# Patient Record
Sex: Female | Born: 1960 | Race: Black or African American | Hispanic: No | Marital: Married
Health system: Southern US, Community
[De-identification: ages and names within clinical notes are randomized; demographics above are authoritative.]

## PROBLEM LIST (undated history)

## (undated) DIAGNOSIS — I10 Essential (primary) hypertension: Secondary | ICD-10-CM

## (undated) DIAGNOSIS — E785 Hyperlipidemia, unspecified: Secondary | ICD-10-CM

## (undated) HISTORY — DX: Hyperlipidemia, unspecified: E78.5

## (undated) HISTORY — DX: Essential (primary) hypertension: I10

---

## 2010-07-23 ENCOUNTER — Ambulatory Visit: Payer: Self-pay | Admitting: Family Medicine

## 2010-08-04 ENCOUNTER — Ambulatory Visit: Payer: Self-pay | Admitting: Family Medicine

## 2010-09-15 ENCOUNTER — Ambulatory Visit: Payer: Self-pay | Admitting: Gastroenterology

## 2011-02-03 ENCOUNTER — Ambulatory Visit: Payer: Self-pay | Admitting: Family Medicine

## 2011-09-21 IMAGING — US ULTRASOUND RIGHT BREAST
1 series · 17 of 17 positions shown · non-contrast
Comparison: 07/23/2010.

REASON FOR EXAM: RT ASSYMMETRY
COMMENTS:

PROCEDURE:     US  - US BREAST RIGHT  - August 04, 2010 [DATE]
RESULT:

[Series 1: ultrasound right breast · 17 of 17 slices shown]
[im 1/17]
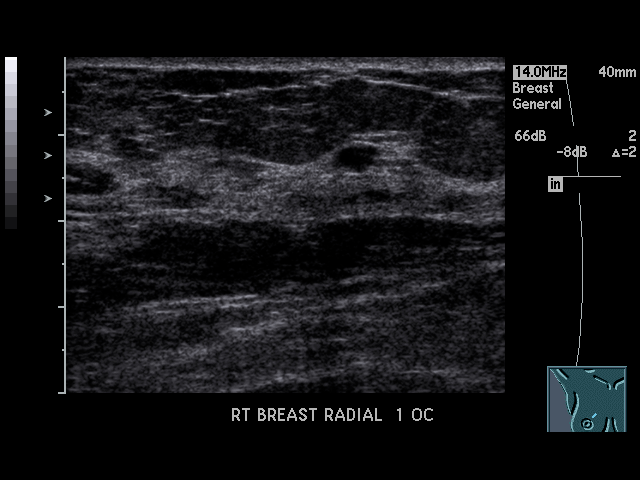
[im 2/17]
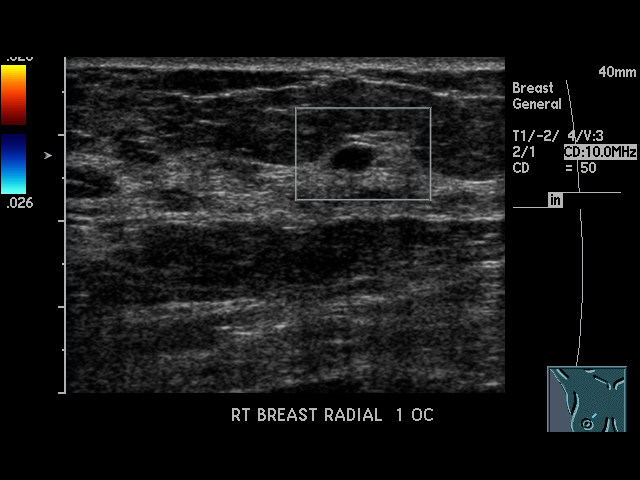
[im 3/17]
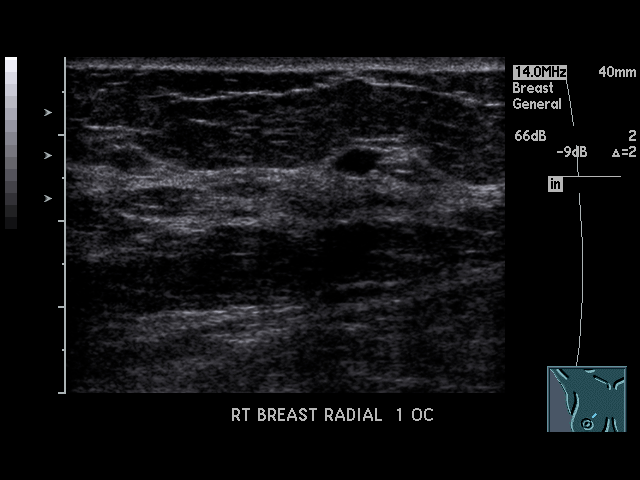
[im 4/17]
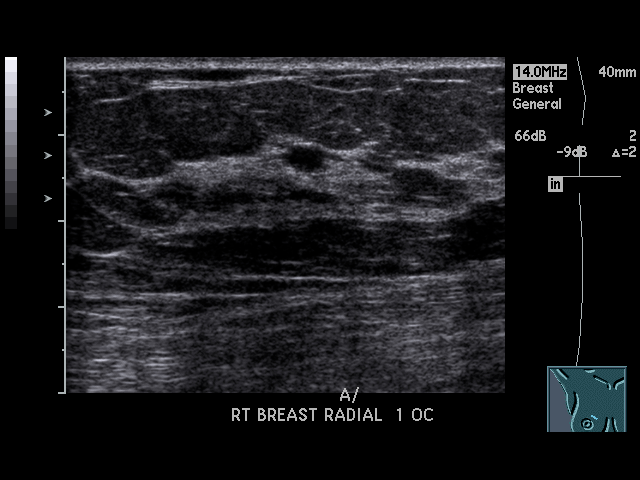
[im 5/17]
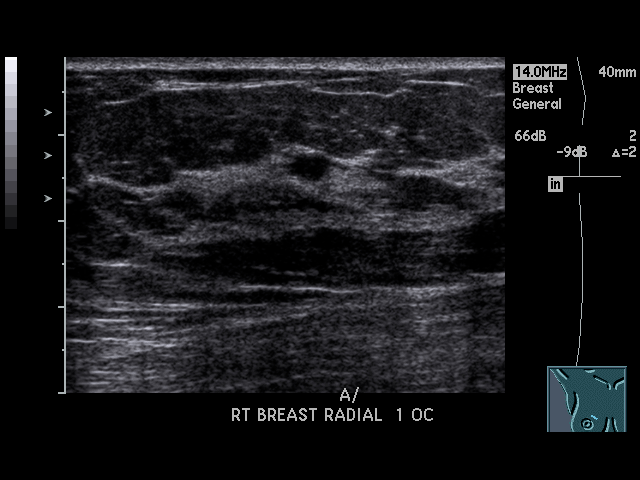
[im 6/17]
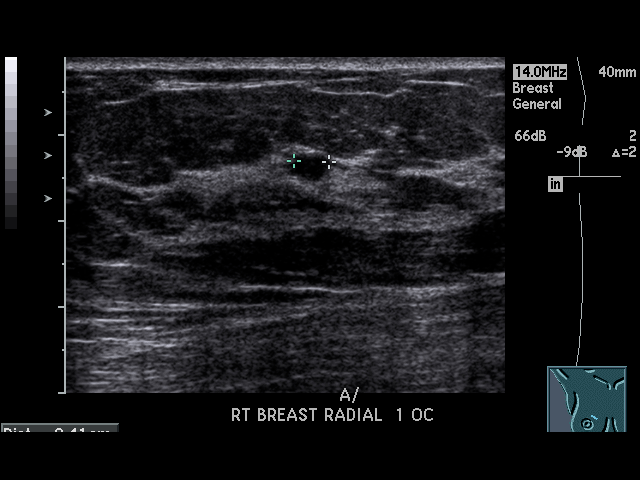
[im 7/17]
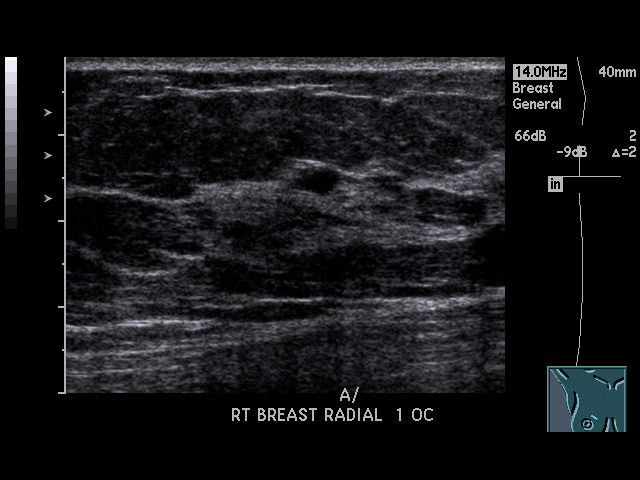
[im 8/17]
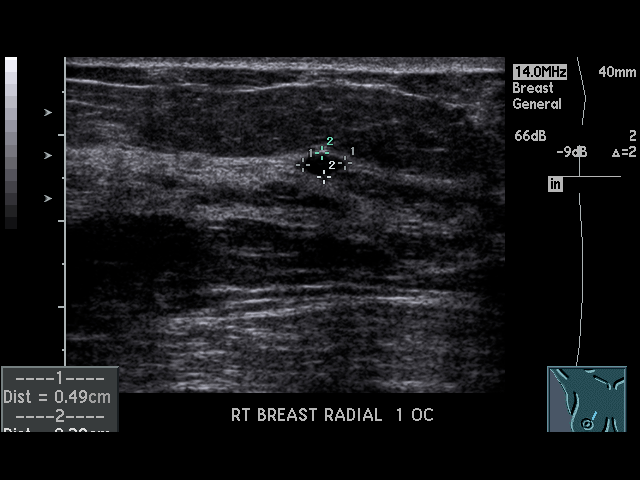
[im 9/17]
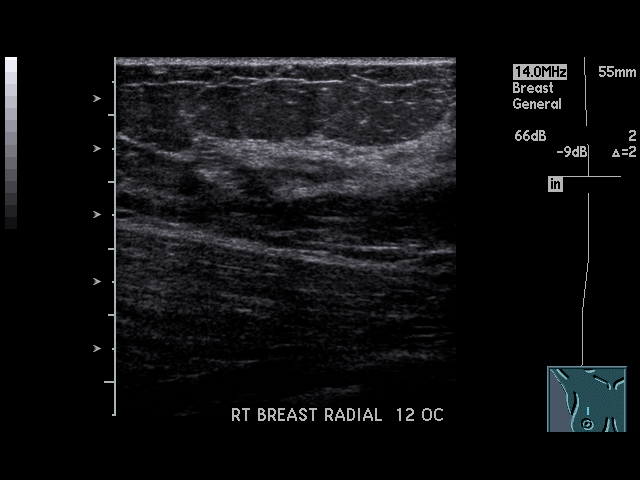
[im 10/17]
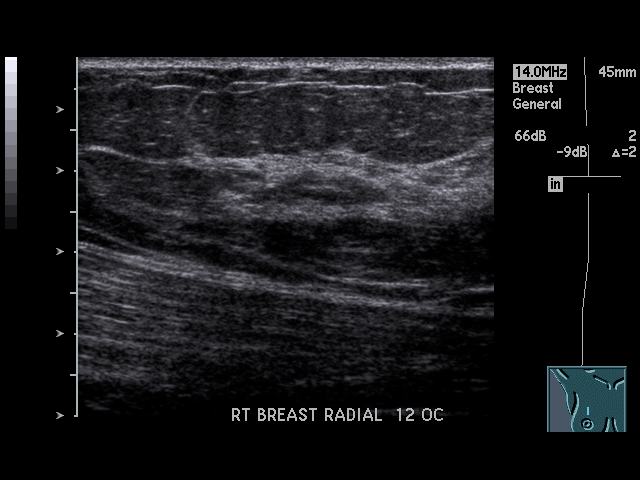
[im 11/17]
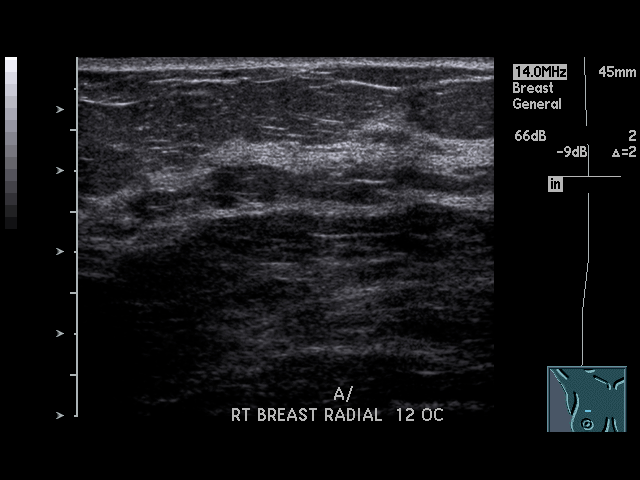
[im 12/17]
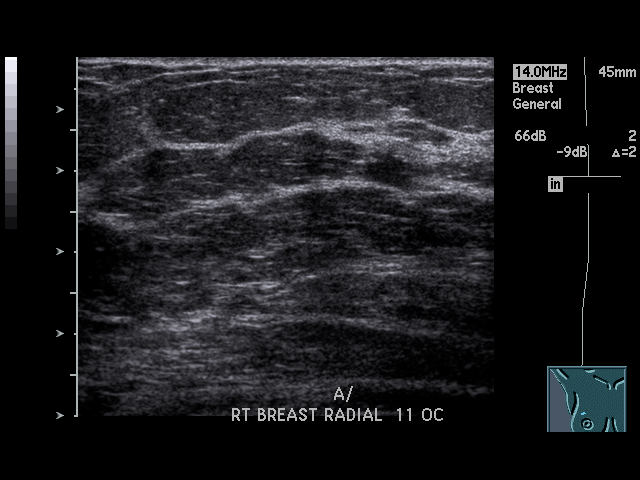
[im 13/17]
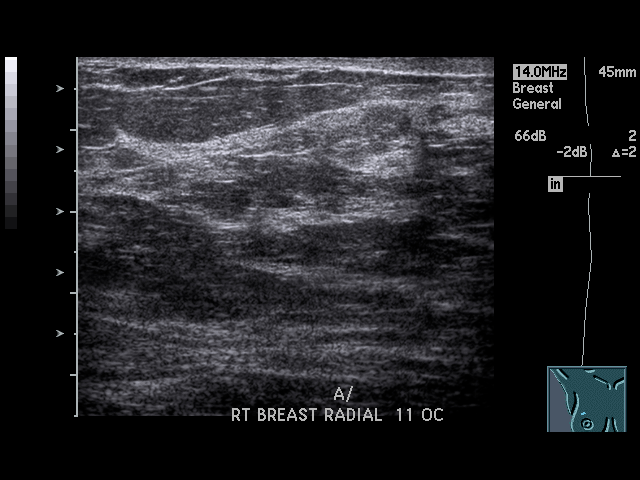
[im 14/17]
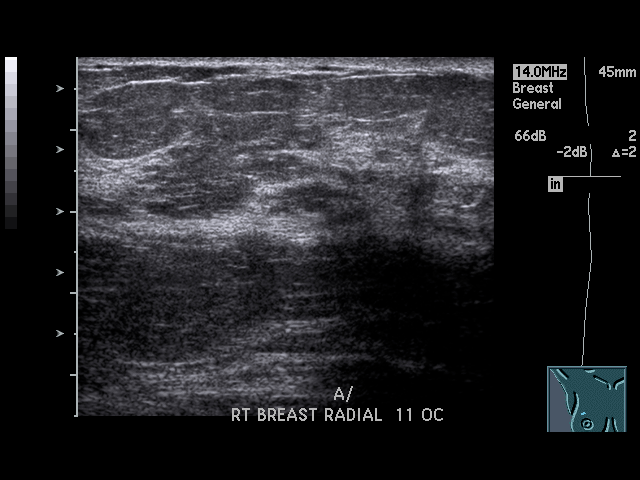
[im 15/17]
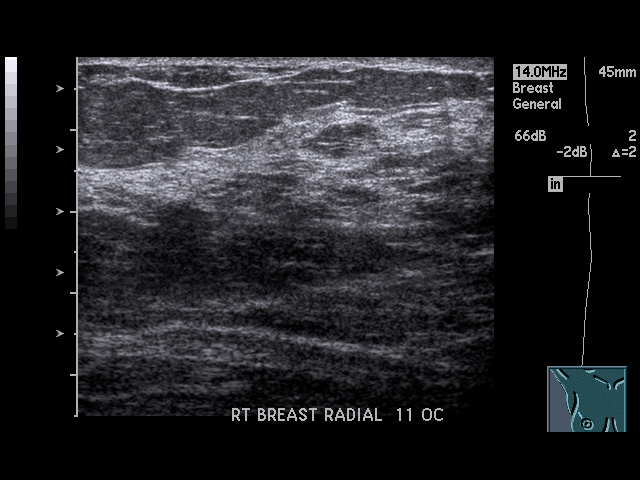
[im 16/17]
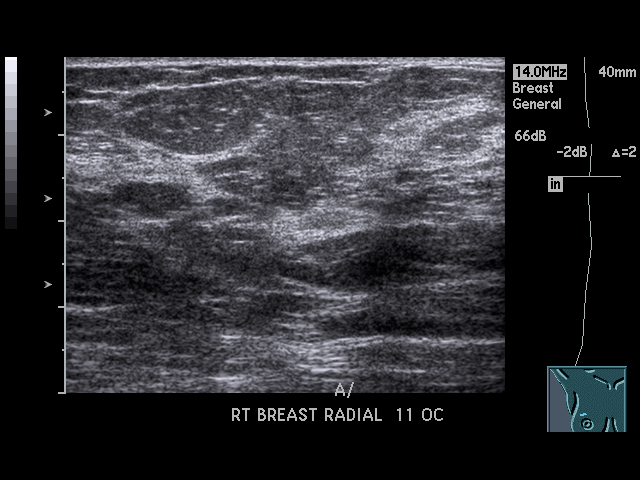
[im 17/17]
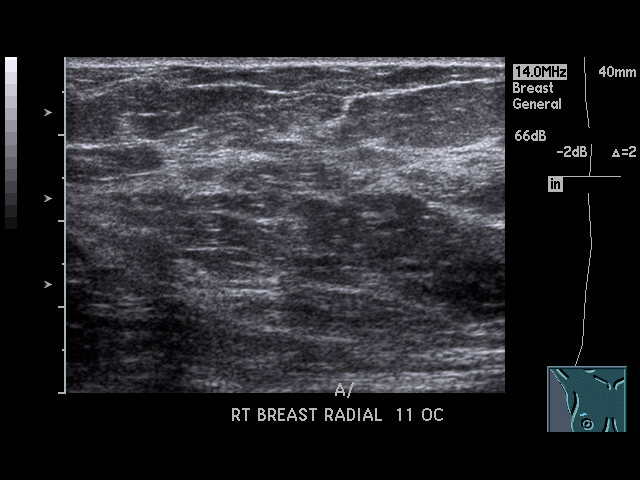

[17 of 17 positions shown; findings below may reference images not displayed]

FINDINGS: True lateral and spot compression views were performed to evaluate
the asymmetry seen in the right CC screening mammogram. There is a well
circumscribed, smoothly marginated, round mass in the superior right breast
at approximately 1 o'clock. On the magnification CC view, an additional,
well circumscribed, smoothly marginated, round mass is seen just lateral.
This appears to be present on the superior aspect of the lateral view, at
approximately 12 o'clock.

Real-time ultrasound was performed of the right breast from 11 to 1 o'clock.
At 1 o'clock, an anechoic mass is seen which measures 5 mm in greatest
diameter. There is a thin, imperceptible wall, with increased through
transmission. The findings are consistent with a benign cyst. The small mass
which was seen on the CC magnification view just lateral to the original
mass was not identified with ultrasound.
IMPRESSION: The small mass at 1 o'clock is consistent with a benign
cyst. The small, well circumscribed mass just lateral at approximately 12
o'clock was unable to be visualized by ultrasound. Given the multiplicity
and mammographic appearance, statistically this likely represents a cyst.
Six month followup mammograms are recommended to ensure stability. At that
time, spot compression views are recommended in addition to CC and true
lateral views.

BI-RADS: Category 3 - Probably Benign Finding (interval follow-up).

## 2015-10-05 HISTORY — PX: ROTATOR CUFF REPAIR: SHX139

## 2023-03-01 ENCOUNTER — Other Ambulatory Visit: Payer: Self-pay | Admitting: Internal Medicine

## 2023-03-01 DIAGNOSIS — I2089 Other forms of angina pectoris: Secondary | ICD-10-CM

## 2023-03-01 DIAGNOSIS — R079 Chest pain, unspecified: Secondary | ICD-10-CM

## 2023-03-31 ENCOUNTER — Telehealth (HOSPITAL_COMMUNITY): Payer: Self-pay | Admitting: *Deleted

## 2023-03-31 MED ORDER — IVABRADINE HCL 7.5 MG PO TABS: ORAL_TABLET | ORAL | 0 refills | Status: AC

## 2023-03-31 MED ORDER — METOPROLOL TARTRATE 100 MG PO TABS: ORAL_TABLET | ORAL | 0 refills | Status: AC

## 2023-03-31 NOTE — Telephone Encounter (Signed)
Attempted to call patient regarding upcoming cardiac CT appointment. °Left message on voicemail with name and callback number ° °Charniece Venturino RN Navigator Cardiac Imaging °[facility] °[phone] Office °[phone] Cell ° °

## 2023-03-31 NOTE — Telephone Encounter (Signed)
Patient returning call about her upcoming cardiac imaging study; pt verbalizes understanding of appt date/time, parking situation and where to check in, pre-test NPO status and medications ordered, and verified current allergies; name and call back number provided for further questions should they arise  Larey Brick RN Navigator Cardiac Imaging [facility] [phone] office [phone] cell  Patient to hold her Maxide and take 100mg  metoprolol tartrate and 15mg  ivabradine two hours prior to her cardiac CT scan.

## 2023-04-04 ENCOUNTER — Ambulatory Visit
Admission: RE | Admit: 2023-04-04 | Discharge: 2023-04-04 | Disposition: A | Discharge status: Home / Self Care | Payer: BC Managed Care – PPO | Source: Ambulatory Visit | Attending: Internal Medicine | Admitting: Internal Medicine

## 2023-04-04 DIAGNOSIS — I2089 Other forms of angina pectoris: Secondary | ICD-10-CM | POA: Insufficient documentation

## 2023-04-04 DIAGNOSIS — R079 Chest pain, unspecified: Secondary | ICD-10-CM | POA: Diagnosis present

## 2023-04-04 LAB — POCT I-STAT CREATININE: Creatinine, Ser: 0.9 mg/dL (ref 0.44–1.00)

## 2023-04-04 MED ORDER — IOHEXOL 350 MG/ML SOLN
80.0000 mL | Freq: Once | INTRAVENOUS | Status: AC | PRN
  Administered 2023-04-04: 80 mL via INTRAVENOUS

## 2023-04-04 MED ORDER — NITROGLYCERIN 0.4 MG SL SUBL
0.8000 mg | SUBLINGUAL_TABLET | Freq: Once | SUBLINGUAL | Status: AC
  Administered 2023-04-04: 0.8 mg via SUBLINGUAL
  Filled 2023-04-04: qty 25

## 2023-04-04 NOTE — Progress Notes (Signed)
Pt tolerated procedure well with no issues. Pt ABCs intact. Pt denies any complaints. Pt encouraged to drink plenty of water throughout the day. Pt ambulatory with steady gait.

## 2024-05-01 ENCOUNTER — Encounter (INDEPENDENT_AMBULATORY_CARE_PROVIDER_SITE_OTHER): Payer: Self-pay | Admitting: Vascular Surgery

## 2024-05-01 ENCOUNTER — Ambulatory Visit (INDEPENDENT_AMBULATORY_CARE_PROVIDER_SITE_OTHER): Payer: Self-pay | Admitting: Vascular Surgery

## 2024-05-01 VITALS — BP 119/85 | HR 85 | Resp 16 | Ht 62.0 in | Wt 216.4 lb

## 2024-05-01 DIAGNOSIS — E785 Hyperlipidemia, unspecified: Secondary | ICD-10-CM | POA: Diagnosis not present

## 2024-05-01 DIAGNOSIS — I890 Lymphedema, not elsewhere classified: Secondary | ICD-10-CM | POA: Diagnosis not present

## 2024-05-01 DIAGNOSIS — I10 Essential (primary) hypertension: Secondary | ICD-10-CM | POA: Diagnosis not present

## 2024-05-10 ENCOUNTER — Encounter (INDEPENDENT_AMBULATORY_CARE_PROVIDER_SITE_OTHER): Payer: Self-pay | Admitting: Vascular Surgery

## 2024-05-10 NOTE — Progress Notes (Signed)
 Subjective:    Patient ID: Jean Pollard, female    DOB: [birth year], 63 y.o.   MRN: [id number] Chief Complaint  Patient presents with   Establish Care    New patient consult, LE Edema. Ref.[place]    Jean Pollard is a 63 yo female who presented to clinic today with chief complaint of bilateral lower extremity edema.  Patient endorses that it is not painful but after standing all day she gets a throbbing to her lower extremities that is irritating.  The swelling is pretty much consistent every day.  It is better in the morning after she gets out of bed but as the day progresses it gets provide worse.  Patient endorses that she has not tried any conventional therapy at this time.  Nor has she had any ultrasounds to her bilateral lower extremities.    Review of Systems  Constitutional: Negative.   Cardiovascular:  Positive for leg swelling.  All other systems reviewed and are negative.      Objective:   Physical Exam Vitals reviewed.  Constitutional:      Appearance: Normal appearance. She is obese.  HENT:     Head: Normocephalic.  Eyes:     Pupils: Pupils are equal, round, and reactive to light.  Cardiovascular:     Rate and Rhythm: Normal rate and regular rhythm.     Pulses: Normal pulses.     Heart sounds: Normal heart sounds.  Pulmonary:     Effort: Pulmonary effort is normal.     Breath sounds: Normal breath sounds.  Abdominal:     General: Abdomen is flat. Bowel sounds are normal.     Palpations: Abdomen is soft.  Musculoskeletal:        General: Swelling present.     Right lower leg: Edema present.     Left lower leg: Edema present.  Skin:    General: Skin is warm and dry.     Capillary Refill: Capillary refill takes 2 to 3 seconds.  Neurological:     General: No focal deficit present.     Mental Status: She is alert and oriented to person, place, and time. Mental status is at baseline.  Psychiatric:        Mood and Affect: Mood normal.        Behavior:  Behavior normal.        Thought Content: Thought content normal.        Judgment: Judgment normal.     BP 119/85 (BP Location: Right Arm, Patient Position: Sitting, Cuff Size: Large)   Pulse 85   Resp 16   Ht 5' 2 (1.575 m)   Wt 216 lb 6.4 oz (98.2 kg)   BMI 39.58 kg/m   Past Medical History:  Diagnosis Date   Hyperlipidemia    Hypertension     Social History   Socioeconomic History   Marital status: Married    Spouse name: Not on file   Number of children: Not on file   Years of education: Not on file   Highest education level: Not on file  Occupational History   Not on file  Tobacco Use   Smoking status: Never   Smokeless tobacco: Never  Vaping Use   Vaping status: Never Used  Substance and Sexual Activity   Alcohol use: Never   Drug use: Never   Sexual activity: Not on file  Other Topics Concern   Not on file  Social History Narrative   Not on file  Social Drivers of Corporate investment banker Strain: Low Risk  (02/20/2024)   Received from [facility]   Overall Financial Resource Strain (CARDIA)    Difficulty of Paying Living Expenses: Not very hard  Food Insecurity: No Food Insecurity (02/20/2024)   Received from [facility]   Hunger Vital Sign    Within the past 12 months, you worried that your food would run out before you got the money to buy more.: Never true    Within the past 12 months, the food you bought just didn't last and you didn't have money to get more.: Never true  Transportation Needs: No Transportation Needs (02/20/2024)   Received from [facility] - Transportation    In the past 12 months, has lack of transportation kept you from medical appointments or from getting medications?: No    Lack of Transportation (Non-Medical): No  Physical Activity: Not on file  Stress: Not on file  Social Connections: Not on file  Intimate Partner Violence: Not on file    Past Surgical  History:  Procedure Laterality Date   ROTATOR CUFF REPAIR Right 2017    Family History  Problem Relation Age of Onset   Cancer Mother        breast/lung    Allergies  Allergen Reactions   Simvastatin Rash        No data to display            CMP     Component Value Date/Time   CREATININE 0.90 04/04/2023 0818     No results found.     Assessment & Plan:   1. Lymphedema (Primary) Recommend:  I have had a long discussion with the patient regarding swelling and why it  causes symptoms.  Patient will begin wearing graduated compression on a daily basis a prescription was given. The patient will  wear the stockings first thing in the morning and removing them in the evening. The patient is instructed specifically not to sleep in the stockings.   In addition, behavioral modification will be initiated.  This will include frequent elevation, use of over the counter pain medications and exercise such as walking.  Consideration for a lymph pump will also be made based upon the effectiveness of conservative therapy.  This would help to improve the edema control and prevent sequela such as ulcers and infections   Patient should undergo duplex ultrasound of the venous system to ensure that DVT or reflux is not present.  The patient will follow-up with me after the ultrasound and completing conventional therapy in 3 months .   2. Essential hypertension Continue antihypertensive medications as already ordered, these medications have been reviewed and there are no changes at this time.  3. Hyperlipidemia, unspecified hyperlipidemia type Continue statin as ordered and reviewed, no changes at this time   Current Outpatient Medications on File Prior to Visit  Medication Sig Dispense Refill   aspirin EC 81 MG tablet Take 81 mg by mouth daily.     cholecalciferol (VITAMIN D3) 25 MCG (1000 UNIT) tablet Take 1,000 Units by mouth daily.     pantoprazole (PROTONIX) 40 MG tablet  Take 40 mg by mouth daily.     polyethylene glycol powder (GLYCOLAX/MIRALAX) 17 GM/SCOOP powder Take 17 g by mouth daily. (Patient taking differently: Take 17 g by mouth as needed for moderate constipation.)     triamterene-hydrochlorothiazide (MAXZIDE-25) 37.5-25 MG tablet Take 1 tablet by mouth  daily.     ivabradine  (CORLANOR) 7.5 MG TABS tablet Take tablets (15mg ) TWO hours prior to your cardiac CT scan. 2 tablet 0   metoprolol  tartrate (LOPRESSOR ) 100 MG tablet Take tablet (100mg ) TWO hours prior to your cardiac CT scan. 1 tablet 0   No current facility-administered medications on file prior to visit.    There are no Patient Instructions on file for this visit. No follow-ups on file.   Gwendlyn JONELLE Shank, NP

## 2024-05-16 ENCOUNTER — Other Ambulatory Visit: Payer: Self-pay

## 2024-05-16 DIAGNOSIS — Z1211 Encounter for screening for malignant neoplasm of colon: Secondary | ICD-10-CM

## 2024-08-01 ENCOUNTER — Encounter (INDEPENDENT_AMBULATORY_CARE_PROVIDER_SITE_OTHER)

## 2024-08-01 ENCOUNTER — Ambulatory Visit (INDEPENDENT_AMBULATORY_CARE_PROVIDER_SITE_OTHER): Admitting: Vascular Surgery
